# Patient Record
Sex: Male | Born: 1995 | Race: White | Hispanic: No | Marital: Single | Smoking: Never smoker
Health system: Southern US, Community
[De-identification: ages and names within clinical notes are randomized; demographics above are authoritative.]

## PROBLEM LIST (undated history)

## (undated) HISTORY — PX: FINGER SURGERY: SHX640

---
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
## 2015-12-03 ENCOUNTER — Encounter: Payer: Self-pay | Admitting: Family Medicine

## 2015-12-03 ENCOUNTER — Ambulatory Visit (INDEPENDENT_AMBULATORY_CARE_PROVIDER_SITE_OTHER): Payer: BC Managed Care – PPO | Admitting: Family Medicine

## 2015-12-03 VITALS — BP 124/84 | HR 79 | Temp 98.6°F | Resp 16 | Ht 72.0 in | Wt 288.2 lb

## 2015-12-03 DIAGNOSIS — Z0000 Encounter for general adult medical examination without abnormal findings: Secondary | ICD-10-CM

## 2015-12-03 DIAGNOSIS — Z23 Encounter for immunization: Secondary | ICD-10-CM | POA: Diagnosis not present

## 2015-12-03 DIAGNOSIS — E669 Obesity, unspecified: Secondary | ICD-10-CM

## 2015-12-03 DIAGNOSIS — R7989 Other specified abnormal findings of blood chemistry: Secondary | ICD-10-CM | POA: Insufficient documentation

## 2015-12-03 LAB — HEPATIC FUNCTION PANEL
ALBUMIN: 4.5 g/dL (ref 3.5–5.2)
ALK PHOS: 89 U/L (ref 52–171)
ALT: 22 U/L (ref 0–53)
AST: 18 U/L (ref 0–37)
BILIRUBIN DIRECT: 0.1 mg/dL (ref 0.0–0.3)
Total Bilirubin: 0.9 mg/dL (ref 0.2–1.2)
Total Protein: 7.6 g/dL (ref 6.0–8.3)

## 2015-12-03 LAB — CBC WITH DIFFERENTIAL/PLATELET
BASOS ABS: 0.1 10*3/uL (ref 0.0–0.1)
BASOS PCT: 1 % (ref 0.0–3.0)
Eosinophils Absolute: 0.2 10*3/uL (ref 0.0–0.7)
Eosinophils Relative: 2.8 % (ref 0.0–5.0)
HEMATOCRIT: 49.8 % — AB (ref 36.0–49.0)
HEMOGLOBIN: 16.9 g/dL — AB (ref 12.0–16.0)
LYMPHS PCT: 29.4 % (ref 24.0–48.0)
Lymphs Abs: 1.6 10*3/uL (ref 0.7–4.0)
MCHC: 33.9 g/dL (ref 31.0–37.0)
MCV: 90.8 fl (ref 78.0–98.0)
MONOS PCT: 7.7 % (ref 3.0–12.0)
Monocytes Absolute: 0.4 10*3/uL (ref 0.1–1.0)
NEUTROS ABS: 3.3 10*3/uL (ref 1.4–7.7)
Neutrophils Relative %: 59.1 % (ref 43.0–71.0)
PLATELETS: 206 10*3/uL (ref 150.0–575.0)
RBC: 5.49 Mil/uL (ref 3.80–5.70)
RDW: 13 % (ref 11.4–15.5)
WBC: 5.6 10*3/uL (ref 4.5–13.5)

## 2015-12-03 LAB — VITAMIN D 25 HYDROXY (VIT D DEFICIENCY, FRACTURES): VITD: 18.33 ng/mL — ABNORMAL LOW (ref 30.00–100.00)

## 2015-12-03 LAB — TSH: TSH: 2.34 u[IU]/mL (ref 0.40–5.00)

## 2015-12-03 LAB — LIPID PANEL
CHOLESTEROL: 85 mg/dL (ref 0–200)
HDL: 29.6 mg/dL — ABNORMAL LOW (ref >39.00)
LDL CALC: 39 mg/dL (ref 0–99)
NONHDL: 54.95
TRIGLYCERIDES: 78 mg/dL (ref 0.0–149.0)
Total CHOL/HDL Ratio: 3
VLDL: 15.6 mg/dL (ref 0.0–40.0)

## 2015-12-03 LAB — BASIC METABOLIC PANEL
BUN: 14 mg/dL (ref 6–23)
CALCIUM: 9.5 mg/dL (ref 8.4–10.5)
CO2: 25 mEq/L (ref 19–32)
Chloride: 106 mEq/L (ref 96–112)
Creatinine, Ser: 0.94 mg/dL (ref 0.40–1.50)
GFR: 109.24 mL/min (ref >60.00)
Glucose, Bld: 93 mg/dL (ref 70–99)
Potassium: 4 mEq/L (ref 3.5–5.1)
SODIUM: 139 meq/L (ref 135–145)

## 2015-12-03 NOTE — Patient Instructions (Signed)
Follow up in 1 year or as needed We'll notify you of your lab results and make any changes if needed Please work on healthy diet and regular exercise STOP SMOKING!!! Call with any questions or concerns Welcome!  We're glad to have you!!!

## 2015-12-03 NOTE — Assessment & Plan Note (Signed)
Pt's PE WNL w/ exception of obesity and smell of cigarettes.  Due for 2nd meningitis- given today.  Check labs due to obesity.  Stressed need for smoking cessation, healthy diet, and regular exercise.  Anticipatory guidance provided.

## 2015-12-03 NOTE — Assessment & Plan Note (Signed)
New to provider, ongoing for pt.  Apparently he has lost ~60 lbs in the last year.  Stressed that this needs to occur via healthy diet and regular exercise and NOT smoking.  Check labs to risk stratify.  Will follow.

## 2015-12-03 NOTE — Assessment & Plan Note (Signed)
New to provider, pt has hx of this.  Check labs and replete prn.

## 2015-12-03 NOTE — Progress Notes (Signed)
   Subjective:    Patient ID: Brian Green, male    DOB: 08/14/1996, 20 y.o.   MRN: [id number]  HPI New to establish.  Previous MD- Dr Yehuda BuddSpears.    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  Obesity- pt is currently 288 lbs and mom reports this is a ~60 lb weight loss over the last year.  Not exercising.  Is trying to eat less.  Started smoking which has decreased his appetite.     Review of Systems Patient reports no vision/ hearing changes, adenopathy,fever,  persistant/recurrent hoarseness , swallowing issues, chest pain, palpitations, edema, persistant/recurrent cough, hemoptysis, dyspnea (rest/exertional/paroxysmal nocturnal), gastrointestinal bleeding (melena, rectal bleeding), abdominal pain, significant heartburn, bowel changes, GU symptoms (dysuria, hematuria, incontinence), Gyn symptoms (abnormal  bleeding, pain),  syncope, focal weakness, memory loss, numbness & tingling, skin/hair/nail changes, abnormal bruising or bleeding, anxiety, or depression.     Objective:   Physical Exam General Appearance:    Alert, cooperative, no distress, appears stated age, obese  Head:    Normocephalic, without obvious abnormality, atraumatic  Eyes:    PERRL, conjunctiva/corneas clear, EOM's intact, fundi    benign, both eyes       Ears:    Normal TM's and external ear canals, both ears  Nose:   Nares normal, septum midline, mucosa normal, no drainage   or sinus tenderness  Throat:   Lips, mucosa, and tongue normal; teeth and gums normal  Neck:   Supple, symmetrical, trachea midline, no adenopathy;       thyroid:  No enlargement/tenderness/nodules  Back:     Symmetric, no curvature, ROM normal, no CVA tenderness  Lungs:     Clear to auscultation bilaterally, respirations unlabored  Chest wall:    No tenderness or deformity  Heart:    Regular rate and rhythm, S1 and S2 normal, no murmur, rub   or gallop  Abdomen:     Soft, non-tender, bowel sounds active all four quadrants,    no masses, no  organomegaly  Genitalia:    Deferred at pt request  Rectal:    Extremities:   Extremities normal, atraumatic, no cyanosis or edema  Pulses:   2+ and symmetric all extremities  Skin:   Skin color, texture, turgor normal, no rashes or lesions  Lymph nodes:   Cervical, supraclavicular, and axillary nodes normal  Neurologic:   CNII-XII intact. Normal strength, sensation and reflexes      throughout          Assessment & Plan:

## 2015-12-05 ENCOUNTER — Other Ambulatory Visit: Payer: Self-pay | Admitting: General Practice

## 2015-12-05 MED ORDER — VITAMIN D (ERGOCALCIFEROL) 1.25 MG (50000 UNIT) PO CAPS: 50000.0000 [IU] | ORAL_CAPSULE | ORAL | Status: AC

## 2016-01-08 ENCOUNTER — Telehealth: Payer: Self-pay | Admitting: Family Medicine

## 2016-01-08 ENCOUNTER — Ambulatory Visit: Payer: BC Managed Care – PPO | Admitting: Family Medicine

## 2016-01-08 DIAGNOSIS — Z0289 Encounter for other administrative examinations: Secondary | ICD-10-CM

## 2016-01-10 ENCOUNTER — Encounter: Payer: Self-pay | Admitting: Family Medicine

## 2016-01-10 NOTE — Telephone Encounter (Signed)
Yes- charge.  He scheduled acute visit and then didn't come

## 2016-01-10 NOTE — Telephone Encounter (Signed)
Marked to charge and mailing no show letter °

## 2016-01-10 NOTE — Telephone Encounter (Signed)
Pt was no show 01/08/16 10:45am for acute appt,  1st no show, pt has not rescheduled, charge or no charge?

## 2017-08-04 ENCOUNTER — Other Ambulatory Visit: Payer: Self-pay | Admitting: Occupational Medicine

## 2017-08-04 ENCOUNTER — Ambulatory Visit: Payer: Self-pay

## 2017-08-04 DIAGNOSIS — M25572 Pain in left ankle and joints of left foot: Secondary | ICD-10-CM

## 2017-08-04 DIAGNOSIS — M79672 Pain in left foot: Secondary | ICD-10-CM

## 2017-08-21 ENCOUNTER — Other Ambulatory Visit: Payer: Self-pay

## 2017-08-21 ENCOUNTER — Emergency Department (HOSPITAL_COMMUNITY)
Admission: EM | Admit: 2017-08-21 | Discharge: 2017-08-22 | Disposition: A | Discharge status: Home / Self Care | Payer: Self-pay | Attending: Emergency Medicine | Admitting: Emergency Medicine

## 2017-08-21 ENCOUNTER — Emergency Department (HOSPITAL_COMMUNITY): Payer: Self-pay

## 2017-08-21 DIAGNOSIS — Z79899 Other long term (current) drug therapy: Secondary | ICD-10-CM | POA: Insufficient documentation

## 2017-08-21 DIAGNOSIS — J020 Streptococcal pharyngitis: Secondary | ICD-10-CM | POA: Insufficient documentation

## 2017-08-21 LAB — CBC WITH DIFFERENTIAL/PLATELET
BASOS PCT: 0 %
Basophils Absolute: 0 10*3/uL (ref 0.0–0.1)
EOS ABS: 0 10*3/uL (ref 0.0–0.7)
Eosinophils Relative: 0 %
HCT: 45.1 % (ref 39.0–52.0)
HEMOGLOBIN: 15.7 g/dL (ref 13.0–17.0)
Lymphocytes Relative: 5 %
Lymphs Abs: 0.8 10*3/uL (ref 0.7–4.0)
MCH: 31.8 pg (ref 26.0–34.0)
MCHC: 34.8 g/dL (ref 30.0–36.0)
MCV: 91.3 fL (ref 78.0–100.0)
MONOS PCT: 7 %
Monocytes Absolute: 1.1 10*3/uL — ABNORMAL HIGH (ref 0.1–1.0)
NEUTROS PCT: 88 %
Neutro Abs: 13.9 10*3/uL — ABNORMAL HIGH (ref 1.7–7.7)
Platelets: 164 10*3/uL (ref 150–400)
RBC: 4.94 MIL/uL (ref 4.22–5.81)
RDW: 12.1 % (ref 11.5–15.5)
WBC: 15.8 10*3/uL — AB (ref 4.0–10.5)

## 2017-08-21 LAB — COMPREHENSIVE METABOLIC PANEL
ALK PHOS: 66 U/L (ref 38–126)
ALT: 35 U/L (ref 17–63)
AST: 25 U/L (ref 15–41)
Albumin: 3.9 g/dL (ref 3.5–5.0)
Anion gap: 10 (ref 5–15)
BILIRUBIN TOTAL: 0.7 mg/dL (ref 0.3–1.2)
BUN: 11 mg/dL (ref 6–20)
CALCIUM: 8.7 mg/dL — AB (ref 8.9–10.3)
CO2: 20 mmol/L — AB (ref 22–32)
CREATININE: 1.1 mg/dL (ref 0.61–1.24)
Chloride: 101 mmol/L (ref 101–111)
Glucose, Bld: 113 mg/dL — ABNORMAL HIGH (ref 65–99)
Potassium: 3.1 mmol/L — ABNORMAL LOW (ref 3.5–5.1)
Sodium: 131 mmol/L — ABNORMAL LOW (ref 135–145)
Total Protein: 7.4 g/dL (ref 6.5–8.1)

## 2017-08-21 LAB — MONONUCLEOSIS SCREEN: MONO SCREEN: NEGATIVE

## 2017-08-21 LAB — RAPID STREP SCREEN (MED CTR MEBANE ONLY): Streptococcus, Group A Screen (Direct): POSITIVE — AB

## 2017-08-21 MED ORDER — SODIUM CHLORIDE 0.9 % IV BOLUS (SEPSIS)
1000.0000 mL | Freq: Once | INTRAVENOUS | Status: AC
  Administered 2017-08-21: 1000 mL via INTRAVENOUS

## 2017-08-21 MED ORDER — CLINDAMYCIN PHOSPHATE 600 MG/50ML IV SOLN
600.0000 mg | Freq: Once | INTRAVENOUS | Status: AC
  Administered 2017-08-21: 600 mg via INTRAVENOUS
  Filled 2017-08-21: qty 50

## 2017-08-21 MED ORDER — SODIUM CHLORIDE 0.9 % IV BOLUS (SEPSIS)
2000.0000 mL | Freq: Once | INTRAVENOUS | Status: AC
  Administered 2017-08-21: 2000 mL via INTRAVENOUS

## 2017-08-21 MED ORDER — ACETAMINOPHEN 325 MG PO TABS
650.0000 mg | ORAL_TABLET | Freq: Once | ORAL | Status: AC | PRN
  Administered 2017-08-21: 650 mg via ORAL
  Filled 2017-08-21: qty 2

## 2017-08-21 MED ORDER — IOPAMIDOL (ISOVUE-300) INJECTION 61%
INTRAVENOUS | Status: AC
  Administered 2017-08-21: 75 mL
  Filled 2017-08-21: qty 75

## 2017-08-21 MED ORDER — KETOROLAC TROMETHAMINE 30 MG/ML IJ SOLN
30.0000 mg | Freq: Once | INTRAMUSCULAR | Status: AC
  Administered 2017-08-21: 30 mg via INTRAVENOUS
  Filled 2017-08-21: qty 1

## 2017-08-21 NOTE — ED Notes (Signed)
Pt taken to Ct then xray

## 2017-08-21 NOTE — ED Notes (Signed)
Delay in lab draw,  Pt not in room 

## 2017-08-21 NOTE — ED Notes (Signed)
Attempted to obtain blood cultures x2 without success

## 2017-08-21 NOTE — ED Triage Notes (Signed)
Pt is from home and was home today and started feeling sick with a sore throat and severe pain. Pt has been very lethargic and pale. He has been very dizzy and weak today. Fevers and chills. Pt stated he is aching all over and severe headache.

## 2017-08-21 NOTE — ED Provider Notes (Signed)
[facility] EMERGENCY DEPARTMENT Provider Note   CSN: [id number] Arrival date & time: 08/21/17  2138     History   Chief Complaint Chief Complaint  Patient presents with  . Sore Throat    HPI Brian Green is a 21 y.o. male.  Otherwise healthy 21 year old male presenting from home with a 1 day history of fever, chills, body aches, sore throat weakness and dizziness.  States he felt well when he went to bed last night.  Woke up this morning with a severe sore throat, fever to 104, body aches and headaches.  Has had fatigue and not eating and drinking at all today.  No vomiting or diarrhea.  No chest pain or shortness of breath.  Minimal cough.  No sick contacts.  Did not receive a flu shot.  Denies abdominal pain, chest pain.   The history is provided by the patient.  Sore Throat  Pertinent negatives include no abdominal pain, no headaches and no shortness of breath.    No past medical history on file.  Patient Active Problem List   Diagnosis Date Noted  . Low serum vitamin D 12/03/2015  . Physical exam 12/03/2015  . Obesity (BMI 30-39.9) 12/03/2015    Past Surgical History:  Procedure Laterality Date  . FINGER SURGERY         Home Medications    Prior to Admission medications   Medication Sig Start Date End Date Taking? Authorizing Provider  fexofenadine (ALLEGRA) 180 MG tablet Take 180 mg by mouth daily.    [provider]  Vitamin D, Ergocalciferol, (DRISDOL) 50000 units CAPS capsule Take 1 capsule (50,000 Units total) by mouth every 7 (seven) days. 12/05/15   Sheliah Hatch, MD    Family History No family history on file.  Social History Social History   Tobacco Use  . Smoking status: Never Smoker  Substance Use Topics  . Alcohol use: No  . Drug use: No     Allergies   Cefzil [cefprozil] and Geodon [ziprasidone hcl]   Review of Systems Review of Systems  Constitutional: Positive for activity change, appetite  change, chills, fatigue and fever.  HENT: Positive for sinus pressure, sore throat and trouble swallowing. Negative for dental problem and ear discharge.   Respiratory: Negative for cough, chest tightness and shortness of breath.   Gastrointestinal: Positive for vomiting. Negative for abdominal pain.  Genitourinary: Negative for dysuria, hematuria, penile swelling and scrotal swelling.  Musculoskeletal: Positive for arthralgias and myalgias. Negative for back pain and neck pain.  Skin: Negative for rash.  Neurological: Negative for dizziness, weakness and headaches.   all other systems are negative except as noted in the HPI and PMH.    Physical Exam Updated Vital Signs BP 102/60 (BP Location: Left Arm)   Pulse (!) 123   Temp (!) 103 F (39.4 C) (Oral)   Ht 6\' 2"  (1.88 m)   Wt 115.7 kg (255 lb)   SpO2 100%   BMI 32.74 kg/m   Physical Exam  Constitutional: He is oriented to person, place, and time. He appears well-developed and well-nourished. No distress.  Ill appearing  HENT:  Head: Normocephalic and atraumatic.  Mouth/Throat: Oropharyngeal exudate present.  Bilateral tonsillar exudates with erythema and edema, uvula midline, no asymmetry,  Eyes: Conjunctivae and EOM are normal. Pupils are equal, round, and reactive to light.  Neck: Normal range of motion. Neck supple.  No meningismus.  Cardiovascular: Normal rate, normal heart sounds and intact distal pulses.  No murmur heard. Tachycardic to 120s  Pulmonary/Chest: Effort normal and breath sounds normal. No respiratory distress. He exhibits no tenderness.  Abdominal: Soft. There is no tenderness. There is no rebound and no guarding.  Musculoskeletal: Normal range of motion. He exhibits no edema or tenderness.  Lymphadenopathy:    He has cervical adenopathy.  Neurological: He is alert and oriented to person, place, and time. No cranial nerve deficit. He exhibits normal muscle tone. Coordination normal.  No ataxia on finger  to nose bilaterally. No pronator drift. 5/5 strength throughout. CN 2-12 intact.Equal grip strength. Sensation intact.   Skin: Skin is warm.  Psychiatric: He has a normal mood and affect. His behavior is normal.  Nursing note and vitals reviewed.    ED Treatments / Results  Labs (all labs ordered are listed, but only abnormal results are displayed) Labs Reviewed  RAPID STREP SCREEN (NOT AT [facility]) - Abnormal; Notable for the following components:      Result Value   Streptococcus, Group A Screen (Direct) POSITIVE (*)    All other components within normal limits  COMPREHENSIVE METABOLIC PANEL - Abnormal; Notable for the following components:   Sodium 131 (*)    Potassium 3.1 (*)    CO2 20 (*)    Glucose, Bld 113 (*)    Calcium 8.7 (*)    All other components within normal limits  CBC WITH DIFFERENTIAL/PLATELET - Abnormal; Notable for the following components:   WBC 15.8 (*)    Neutro Abs 13.9 (*)    Monocytes Absolute 1.1 (*)    All other components within normal limits  CULTURE, BLOOD (ROUTINE X 2)  CULTURE, BLOOD (ROUTINE X 2)  MONONUCLEOSIS SCREEN  INFLUENZA PANEL BY PCR (TYPE A & B)  I-STAT CG4 LACTIC ACID, ED  I-STAT CG4 LACTIC ACID, ED    EKG  EKG Interpretation None       Radiology Dg Chest 2 View  Result Date: 08/22/2017 CLINICAL DATA:  Sepsis. EXAM: CHEST  2 VIEW COMPARISON:  None. FINDINGS: The cardiomediastinal contours are normal. The lungs are clear. Pulmonary vasculature is normal. No consolidation, pleural effusion, or pneumothorax. No acute osseous abnormalities are seen. IMPRESSION: No acute pulmonary process. Electronically Signed   By: Rubye OaksMelanie  Ehinger M.D.   On: 08/22/2017 01:08   Ct Soft Tissue Neck W Contrast  Result Date: 08/22/2017 CLINICAL DATA:  Initial evaluation for acute sore throat, fevers, chills. EXAM: CT NECK WITH CONTRAST TECHNIQUE: Multidetector CT imaging of the neck was performed using the standard protocol following the bolus  administration of intravenous contrast. CONTRAST:  75mL ISOVUE-300 IOPAMIDOL (ISOVUE-300) INJECTION 61% COMPARISON:  None. FINDINGS: Pharynx and larynx: Oral cavity within normal limits without mass lesion for loculated fluid collection. No acute abnormality about the dentition. Dental carie noted at the left second maxillary molar. Palatine tonsils are enlarged and prominent bilaterally, suggesting possible acute tonsillitis. No discrete tonsillar or peritonsillar abscess. Parapharyngeal fat maintained. Adenoidal soft tissues mildly prominent as well. Nasopharynx otherwise unremarkable. No retropharyngeal collection. Epiglottis normal. Vallecula clear. Remainder of the hypopharynx and supraglottic larynx within normal limits. True cords normal. Subglottic airway clear. Salivary glands: Salivary glands including the parotid and submandibular glands are normal. Thyroid: Thyroid normal. Lymph nodes: Mildly prominent bilateral level 2 lymph nodes measure up to 14 mm on the right and 13 mm on the left. No other pathologically enlarged lymph nodes identified within the neck. Vascular: Normal intravascular enhancement seen throughout the neck. Limited intracranial: Unremarkable. Visualized orbits: Visualized globes and  orbital soft tissues are normal. Mastoids and visualized paranasal sinuses: Left maxillary sinus retention cyst. Mild right maxillary sinus mucosal thickening. Visualized paranasal sinuses are otherwise clear. Mastoids and middle ear cavities are clear. Skeleton: No acute osseus abnormality. No worrisome lytic or blastic osseous lesions. Upper chest: Visualized upper chest within normal limits. Partially visualized lungs are clear. Other: None. IMPRESSION: 1. Symmetric enlargement of the palatine tonsils bilaterally with prominence of the adenoidal soft tissues. Findings suggestive of acute tonsillitis. No drainable tonsillar or peritonsillar abscess identified. 2. Mildly enlarged bilateral level II lymph  nodes, likely reactive. 3. Dental carie involving the left second maxillary molar. No associated acute inflammatory changes. Outpatient dental referral recommended. Electronically Signed   By: Rise MuBenjamin  McClintock M.D.   On: 08/22/2017 00:47    Procedures Procedures (including critical care time)  Medications Ordered in ED Medications  sodium chloride 0.9 % bolus 2,000 mL (not administered)  sodium chloride 0.9 % bolus 1,000 mL (not administered)  sodium chloride 0.9 % bolus 1,000 mL (not administered)  clindamycin (CLEOCIN) IVPB 600 mg (not administered)  acetaminophen (TYLENOL) tablet 650 mg (650 mg Oral Given 08/21/17 2225)     Initial Impression / Assessment and Plan / ED Course  I have reviewed the triage vital signs and the nursing notes.  Pertinent labs & imaging results that were available during my care of the patient were reviewed by me and considered in my medical decision making (see chart for details).    Patient with 1 day history of sore throat, fever, headache and body aches.  He has ill-appearing but nontoxic.  He is febrile and tachycardic.  We will treat for strep pharyngitis and sepsis with IV fluids and IV antibiotics.  CT is negative for abscess or drainable fluid collection.  Patient has improved after IV fluids and IV antibiotics.  He is tolerating p.o.  Patient's heart rate and fever have improved.  He appears well and nontoxic and is tolerating secretions.  Will discharge with p.o. antibiotics and symptom control.  Return precautions discussed.  BP 121/61   Pulse 93   Temp (!) 100.4 F (38 C) (Oral)   Resp 12   Ht 6\' 2"  (1.88 m)   Wt 115.7 kg (255 lb)   SpO2 98%   BMI 32.74 kg/m   Final Clinical Impressions(s) / ED Diagnoses   Final diagnoses:  Strep pharyngitis    ED Discharge Orders    None       Nirvan Laban, Jeannett SeniorStephen, MD 08/22/17 [phone]

## 2017-08-22 ENCOUNTER — Emergency Department (HOSPITAL_COMMUNITY): Payer: Self-pay

## 2017-08-22 LAB — INFLUENZA PANEL BY PCR (TYPE A & B)
INFLBPCR: NEGATIVE
Influenza A By PCR: NEGATIVE

## 2017-08-22 LAB — I-STAT CG4 LACTIC ACID, ED: LACTIC ACID, VENOUS: 0.79 mmol/L (ref 0.5–1.9)

## 2017-08-22 MED ORDER — CLINDAMYCIN HCL 300 MG PO CAPS: 300.0000 mg | ORAL_CAPSULE | Freq: Three times a day (TID) | ORAL | 0 refills | Status: AC

## 2017-08-22 MED ORDER — SODIUM CHLORIDE 0.9 % IV BOLUS (SEPSIS)
1000.0000 mL | Freq: Once | INTRAVENOUS | Status: DC
Start: 2017-08-22 — End: 2017-08-22

## 2017-08-22 MED ORDER — DEXAMETHASONE SODIUM PHOSPHATE 10 MG/ML IJ SOLN
10.0000 mg | Freq: Once | INTRAMUSCULAR | Status: AC
  Administered 2017-08-22: 10 mg via INTRAVENOUS
  Filled 2017-08-22: qty 1

## 2017-08-22 NOTE — Discharge Instructions (Signed)
Take the antibiotics as prescribed.  Keep yourself hydrated.  Use Tylenol or ibuprofen as needed for fever and aches.  Return to the ED if you develop new or worsening symptoms including difficulty breathing, difficulty swallowing or any other concerns.

## 2017-08-22 NOTE — ED Notes (Signed)
Pt ambulatory to restroom with steady gait; graham crackers and peanut butter given.

## 2017-08-22 NOTE — ED Notes (Signed)
Pt has been tolerating [place] without difficulty

## 2017-08-27 LAB — CULTURE, BLOOD (ROUTINE X 2)
CULTURE: NO GROWTH
CULTURE: NO GROWTH
SPECIAL REQUESTS: ADEQUATE
Special Requests: ADEQUATE

## 2018-07-13 IMAGING — DX DG FOOT COMPLETE 3+V*L*
3 series · 3 of 3 positions shown · non-contrast
Comparison: None.

CLINICAL DATA: Left foot pain after fall today.

EXAM:
LEFT FOOT - COMPLETE 3+ VIEW

[foot ap]
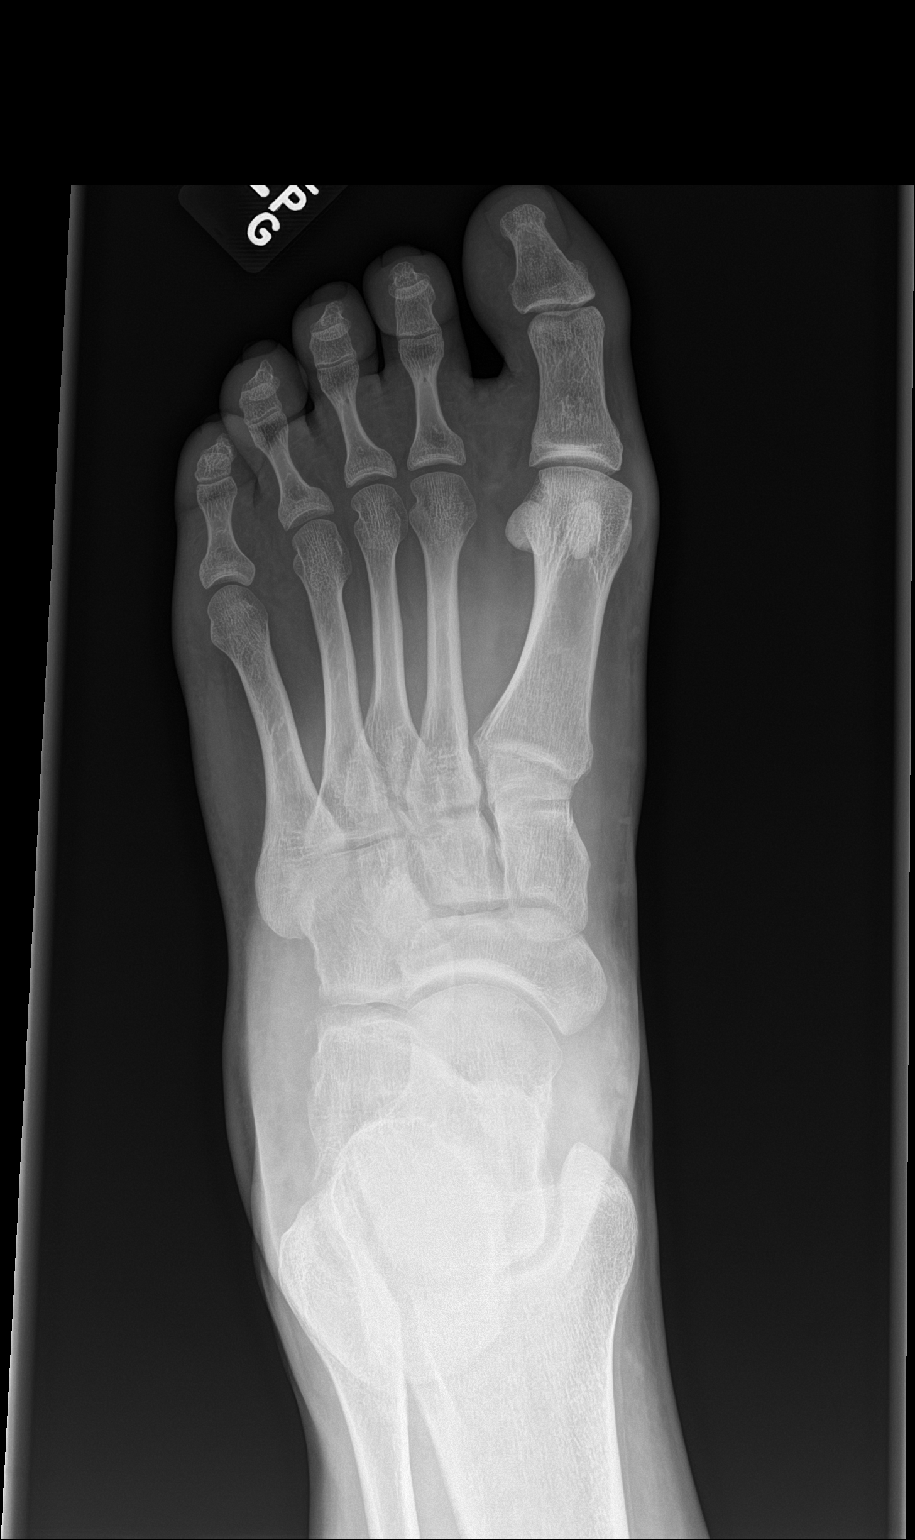

[foot obl]
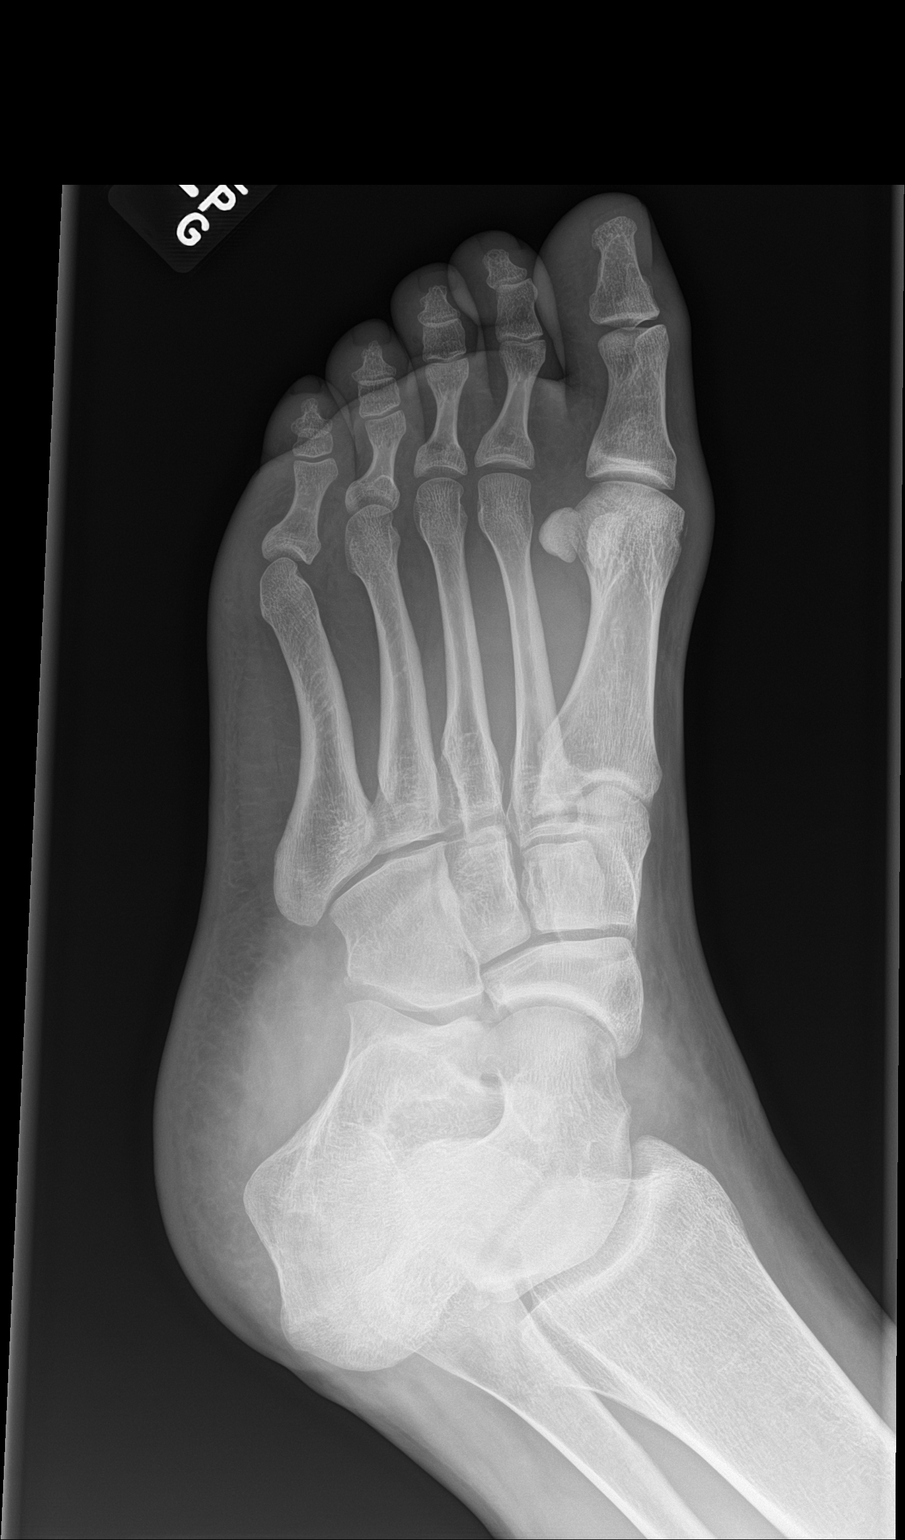

[foot lat]
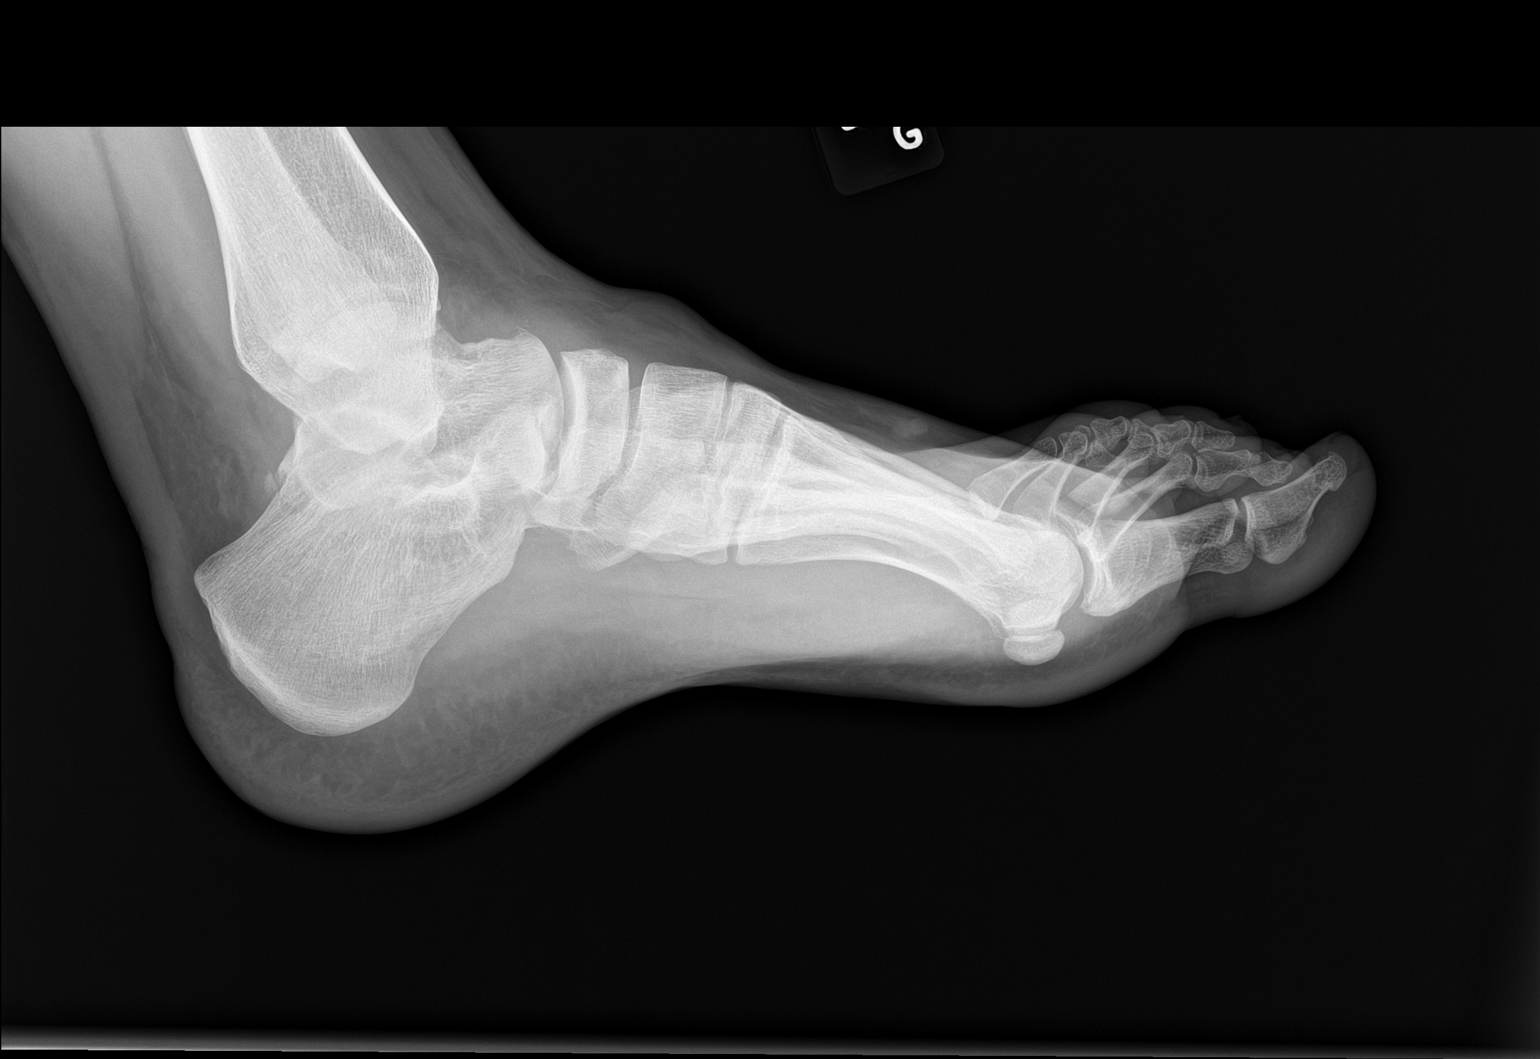

[3 of 3 positions shown; findings below may reference images not displayed]

FINDINGS: There is no evidence of fracture or dislocation. There is no
evidence of arthropathy or other focal bone abnormality. Soft
tissues are unremarkable.
IMPRESSION: Normal left foot.
